# Patient Record
Sex: Female | Born: 1970 | Race: White | Hispanic: No | Marital: Married | Smoking: Never smoker
Health system: Southern US, Community
[De-identification: ages and names within clinical notes are randomized; demographics above are authoritative.]

## PROBLEM LIST (undated history)

## (undated) DIAGNOSIS — W19XXXA Unspecified fall, initial encounter: Secondary | ICD-10-CM

## (undated) DIAGNOSIS — J45909 Unspecified asthma, uncomplicated: Secondary | ICD-10-CM

## (undated) DIAGNOSIS — R112 Nausea with vomiting, unspecified: Secondary | ICD-10-CM

## (undated) DIAGNOSIS — Z9889 Other specified postprocedural states: Secondary | ICD-10-CM

---

## 2006-09-26 ENCOUNTER — Emergency Department: Payer: Self-pay | Admitting: Emergency Medicine

## 2007-02-19 ENCOUNTER — Emergency Department: Payer: Self-pay | Admitting: Emergency Medicine

## 2017-12-01 ENCOUNTER — Other Ambulatory Visit: Payer: Self-pay | Admitting: Internal Medicine

## 2017-12-01 DIAGNOSIS — Z1231 Encounter for screening mammogram for malignant neoplasm of breast: Secondary | ICD-10-CM

## 2017-12-12 ENCOUNTER — Other Ambulatory Visit: Payer: Self-pay | Admitting: Family Medicine

## 2017-12-12 DIAGNOSIS — M24812 Other specific joint derangements of left shoulder, not elsewhere classified: Secondary | ICD-10-CM

## 2017-12-28 ENCOUNTER — Ambulatory Visit: Payer: Self-pay

## 2017-12-29 ENCOUNTER — Ambulatory Visit
Admission: RE | Admit: 2017-12-29 | Discharge: 2017-12-29 | Disposition: A | Discharge status: Home / Self Care | Payer: PRIVATE HEALTH INSURANCE | Source: Ambulatory Visit | Attending: Family Medicine | Admitting: Family Medicine

## 2017-12-29 DIAGNOSIS — M24812 Other specific joint derangements of left shoulder, not elsewhere classified: Secondary | ICD-10-CM

## 2018-01-14 ENCOUNTER — Encounter: Payer: Self-pay | Admitting: Radiology

## 2018-01-14 ENCOUNTER — Ambulatory Visit
Admission: RE | Admit: 2018-01-14 | Discharge: 2018-01-14 | Disposition: A | Discharge status: Home / Self Care | Payer: PRIVATE HEALTH INSURANCE | Source: Ambulatory Visit | Attending: Internal Medicine | Admitting: Internal Medicine

## 2018-01-14 DIAGNOSIS — Z1231 Encounter for screening mammogram for malignant neoplasm of breast: Secondary | ICD-10-CM

## 2018-03-05 ENCOUNTER — Other Ambulatory Visit: Payer: Self-pay | Admitting: Orthopedic Surgery

## 2018-04-07 NOTE — Pre-Procedure Instructions (Signed)
Clarke Ricard Dillonverill  04/07/2018      [id number] - , [place] - [address] [place] [id number] Phone: [phone] Fax: [phone]    Your procedure is scheduled on June 27  [facility] at 530 [facility].M.  Call this number if you have problems the morning of surgery:  [phone]   Remember:  Do not eat or drink after midnight.     Take these medicines the morning of surgery with A SIP OF WATER    Stop taking aspirin, BC's, Goody's, Herbal medications, Fish Oil, Ibuprofen, Advil, Motrin, Vitamins     Do not wear jewelry, make-up or nail polish.  Do not wear lotions, powders, or perfumes, or deodorant.  Do not shave 48 hours prior to surgery.  Men may shave face and neck.  Do not bring valuables to the hospital.  [facility] is not responsible for any belongings or valuables.  Contacts, dentures or bridgework may not be worn into surgery.  Leave your suitcase in the car.  After surgery it may be brought to your room.  For patients admitted to the hospital, discharge time will be determined by your treatment team.  Patients discharged the day of surgery will not be allowed to drive home.   Special instructions:   [place]- Preparing For Surgery  Before surgery, you can play an important role. Because skin is not sterile, your skin needs to be as free of germs as possible. You can reduce the number of germs on your skin by washing with CHG (chlorahexidine gluconate) Soap before surgery.  CHG is an antiseptic cleaner which kills germs and bonds with the skin to continue killing germs even after washing.    Oral Hygiene is also important to reduce your risk of infection.  Remember - BRUSH YOUR TEETH THE MORNING OF SURGERY WITH YOUR REGULAR TOOTHPASTE  Please do not use if you have an allergy to CHG or antibacterial soaps. If your skin becomes reddened/irritated stop using the  CHG.  Do not shave (including legs and underarms) for at least 48 hours prior to first CHG shower. It is OK to shave your face.  Please follow these instructions carefully.   1. Shower the NIGHT BEFORE SURGERY and the MORNING OF SURGERY with CHG.   2. If you chose to wash your hair, wash your hair first as usual with your normal shampoo.  3. After you shampoo, rinse your hair and body thoroughly to remove the shampoo.  4. Use CHG as you would any other liquid soap. You can apply CHG directly to the skin and wash gently with a scrungie or a clean washcloth.   5. Apply the CHG Soap to your body ONLY FROM THE NECK DOWN.  Do not use on open wounds or open sores. Avoid contact with your eyes, ears, mouth and genitals (private parts). Wash Face and genitals (private parts)  with your normal soap.  6. Wash thoroughly, paying special attention to the area where your surgery will be performed.  7. Thoroughly rinse your body with warm water from the neck down.  8. DO NOT shower/wash with your normal soap after using and rinsing off the CHG Soap.  9. Pat yourself dry with a CLEAN TOWEL.  10. Wear CLEAN PAJAMAS to bed the night before surgery, wear comfortable clothes the morning of surgery  11. Place CLEAN SHEETS on your bed the night of your  first shower and DO NOT SLEEP WITH PETS.    Day of Surgery:  Do not apply any deodorants/lotions.  Please wear clean clothes to the hospital/surgery center.   Remember to brush your teeth WITH YOUR REGULAR TOOTHPASTE.    Please read over the following fact sheets that you were given. Pain Booklet, Coughing and Deep Breathing, MRSA Information and Surgical Site Infection Prevention

## 2018-04-08 ENCOUNTER — Encounter (HOSPITAL_COMMUNITY): Payer: Self-pay

## 2018-04-08 ENCOUNTER — Encounter (HOSPITAL_COMMUNITY)
Admission: RE | Admit: 2018-04-08 | Discharge: 2018-04-08 | Disposition: A | Discharge status: Home / Self Care | Payer: PRIVATE HEALTH INSURANCE | Source: Ambulatory Visit | Attending: Orthopedic Surgery | Admitting: Orthopedic Surgery

## 2018-04-08 DIAGNOSIS — Z01812 Encounter for preprocedural laboratory examination: Secondary | ICD-10-CM | POA: Diagnosis not present

## 2018-04-08 HISTORY — DX: Unspecified fall, initial encounter: W19.XXXA

## 2018-04-08 HISTORY — DX: Nausea with vomiting, unspecified: R11.2

## 2018-04-08 HISTORY — DX: Unspecified asthma, uncomplicated: J45.909

## 2018-04-08 HISTORY — DX: Other specified postprocedural states: Z98.890

## 2018-04-08 LAB — CBC
HCT: 44.7 % (ref 36.0–46.0)
Hemoglobin: 14.5 g/dL (ref 12.0–15.0)
MCH: 30.6 pg (ref 26.0–34.0)
MCHC: 32.4 g/dL (ref 30.0–36.0)
MCV: 94.3 fL (ref 78.0–100.0)
Platelets: 212 10*3/uL (ref 150–400)
RBC: 4.74 MIL/uL (ref 3.87–5.11)
RDW: 12.1 % (ref 11.5–15.5)
WBC: 5.8 10*3/uL (ref 4.0–10.5)

## 2018-04-14 NOTE — Anesthesia Preprocedure Evaluation (Addendum)
Anesthesia Evaluation  Patient identified by MRN, date of birth, ID band Patient awake    Reviewed: Allergy & Precautions, NPO status , Patient's Chart, lab work & pertinent test results  History of Anesthesia Complications (+) PONV  Airway Mallampati: II  TM Distance: >3 FB Neck ROM: Full    Dental  (+) Dental Advisory Given, Missing,    Pulmonary asthma ,    breath sounds clear to auscultation       Cardiovascular negative cardio ROS   Rhythm:Regular Rate:Normal     Neuro/Psych negative neurological ROS  negative psych ROS   GI/Hepatic negative GI ROS, Neg liver ROS,   Endo/Other  negative endocrine ROS  Renal/GU negative Renal ROS  negative genitourinary   Musculoskeletal negative musculoskeletal ROS (+)   Abdominal   Peds  Hematology negative hematology ROS (+)   Anesthesia Other Findings   Reproductive/Obstetrics                            Anesthesia Physical Anesthesia Plan  ASA: II  Anesthesia Plan: General   Post-op Pain Management:  Regional for Post-op pain   Induction: Intravenous  PONV Risk Score and Plan: 4 or greater and Treatment may vary due to age or medical condition, Ondansetron, Dexamethasone, Midazolam and Scopolamine patch - Pre-op  Airway Management Planned: Oral ETT  Additional Equipment: None  Intra-op Plan:   Post-operative Plan: Extubation in OR  Informed Consent: I have reviewed the patients History and Physical, chart, labs and discussed the procedure including the risks, benefits and alternatives for the proposed anesthesia with the patient or authorized representative who has indicated his/her understanding and acceptance.   Dental advisory given  Plan Discussed with: CRNA and Anesthesiologist  Anesthesia Plan Comments:         Anesthesia Quick Evaluation

## 2018-04-15 ENCOUNTER — Encounter (HOSPITAL_COMMUNITY): Payer: Self-pay | Admitting: Urology

## 2018-04-15 ENCOUNTER — Ambulatory Visit (HOSPITAL_COMMUNITY)
Admission: RE | Admit: 2018-04-15 | Discharge: 2018-04-15 | Disposition: A | Discharge status: Home / Self Care | Payer: PRIVATE HEALTH INSURANCE | Source: Ambulatory Visit | Attending: Orthopedic Surgery | Admitting: Orthopedic Surgery

## 2018-04-15 ENCOUNTER — Ambulatory Visit (HOSPITAL_COMMUNITY): Payer: PRIVATE HEALTH INSURANCE | Admitting: Anesthesiology

## 2018-04-15 ENCOUNTER — Ambulatory Visit (HOSPITAL_COMMUNITY): Payer: PRIVATE HEALTH INSURANCE

## 2018-04-15 ENCOUNTER — Encounter (HOSPITAL_COMMUNITY)
Admission: RE | Disposition: A | Discharge status: Home / Self Care | Payer: Self-pay | Source: Ambulatory Visit | Attending: Orthopedic Surgery

## 2018-04-15 DIAGNOSIS — J45909 Unspecified asthma, uncomplicated: Secondary | ICD-10-CM | POA: Diagnosis not present

## 2018-04-15 DIAGNOSIS — Z7982 Long term (current) use of aspirin: Secondary | ICD-10-CM | POA: Diagnosis not present

## 2018-04-15 DIAGNOSIS — Y939 Activity, unspecified: Secondary | ICD-10-CM | POA: Diagnosis not present

## 2018-04-15 DIAGNOSIS — Y9355 Activity, bike riding: Secondary | ICD-10-CM | POA: Diagnosis not present

## 2018-04-15 DIAGNOSIS — S43122A Dislocation of left acromioclavicular joint, 100%-200% displacement, initial encounter: Secondary | ICD-10-CM | POA: Diagnosis not present

## 2018-04-15 DIAGNOSIS — Z885 Allergy status to narcotic agent status: Secondary | ICD-10-CM | POA: Diagnosis not present

## 2018-04-15 DIAGNOSIS — X58XXXA Exposure to other specified factors, initial encounter: Secondary | ICD-10-CM | POA: Insufficient documentation

## 2018-04-15 DIAGNOSIS — S43102A Unspecified dislocation of left acromioclavicular joint, initial encounter: Secondary | ICD-10-CM | POA: Diagnosis present

## 2018-04-15 DIAGNOSIS — Z419 Encounter for procedure for purposes other than remedying health state, unspecified: Secondary | ICD-10-CM

## 2018-04-15 HISTORY — PX: ACROMIO-CLAVICULAR JOINT REPAIR: SHX5183

## 2018-04-15 LAB — POCT PREGNANCY, URINE: Preg Test, Ur: NEGATIVE

## 2018-04-15 SURGERY — REPAIR, ACROMIOCLAVICULAR JOINT
Anesthesia: General | Site: Shoulder | Laterality: Left

## 2018-04-15 MED ORDER — OXYCODONE-ACETAMINOPHEN 5-325 MG PO TABS: 1.0000 | ORAL_TABLET | ORAL | 0 refills | Status: DC | PRN

## 2018-04-15 MED ORDER — DEXAMETHASONE SODIUM PHOSPHATE 10 MG/ML IJ SOLN
INTRAMUSCULAR | Status: DC | PRN
  Administered 2018-04-15: 10 mg via INTRAVENOUS

## 2018-04-15 MED ORDER — BUPIVACAINE-EPINEPHRINE 0.25% -1:200000 IJ SOLN: INTRAMUSCULAR | Status: DC | PRN

## 2018-04-15 MED ORDER — SUGAMMADEX SODIUM 200 MG/2ML IV SOLN
INTRAVENOUS | Status: AC
  Filled 2018-04-15: qty 2

## 2018-04-15 MED ORDER — LACTATED RINGERS IV SOLN
INTRAVENOUS | Status: DC | PRN
  Administered 2018-04-15: 07:00:00 via INTRAVENOUS

## 2018-04-15 MED ORDER — OXYCODONE HCL 5 MG/5ML PO SOLN: 5.0000 mg | Freq: Once | ORAL | Status: DC | PRN

## 2018-04-15 MED ORDER — CEFAZOLIN SODIUM-DEXTROSE 2-4 GM/100ML-% IV SOLN
2.0000 g | INTRAVENOUS | Status: AC
  Administered 2018-04-15: 2 g via INTRAVENOUS

## 2018-04-15 MED ORDER — FENTANYL CITRATE (PF) 100 MCG/2ML IJ SOLN
INTRAMUSCULAR | Status: DC | PRN
  Administered 2018-04-15 (×2): 50 ug via INTRAVENOUS

## 2018-04-15 MED ORDER — 0.9 % SODIUM CHLORIDE (POUR BTL) OPTIME
TOPICAL | Status: DC | PRN
  Administered 2018-04-15 (×2): 1000 mL

## 2018-04-15 MED ORDER — ONDANSETRON HCL 4 MG PO TABS: 4.0000 mg | ORAL_TABLET | Freq: Three times a day (TID) | ORAL | 0 refills | Status: DC | PRN

## 2018-04-15 MED ORDER — FENTANYL CITRATE (PF) 250 MCG/5ML IJ SOLN
INTRAMUSCULAR | Status: AC
  Filled 2018-04-15: qty 5

## 2018-04-15 MED ORDER — CEFAZOLIN SODIUM-DEXTROSE 2-4 GM/100ML-% IV SOLN
INTRAVENOUS | Status: AC
  Filled 2018-04-15: qty 100

## 2018-04-15 MED ORDER — PHENYLEPHRINE 40 MCG/ML (10ML) SYRINGE FOR IV PUSH (FOR BLOOD PRESSURE SUPPORT)
PREFILLED_SYRINGE | INTRAVENOUS | Status: AC
  Filled 2018-04-15: qty 10

## 2018-04-15 MED ORDER — PROPOFOL 10 MG/ML IV BOLUS
INTRAVENOUS | Status: DC | PRN
  Administered 2018-04-15: 180 mg via INTRAVENOUS

## 2018-04-15 MED ORDER — ONDANSETRON HCL 4 MG/2ML IJ SOLN
INTRAMUSCULAR | Status: DC | PRN
  Administered 2018-04-15: 4 mg via INTRAVENOUS

## 2018-04-15 MED ORDER — BUPIVACAINE-EPINEPHRINE (PF) 0.5% -1:200000 IJ SOLN
INTRAMUSCULAR | Status: DC | PRN
  Administered 2018-04-15: 15 mL via PERINEURAL

## 2018-04-15 MED ORDER — BUPIVACAINE LIPOSOME 1.3 % IJ SUSP
INTRAMUSCULAR | Status: DC | PRN
  Administered 2018-04-15: 10 mL via PERINEURAL

## 2018-04-15 MED ORDER — MIDAZOLAM HCL 2 MG/2ML IJ SOLN
INTRAMUSCULAR | Status: AC
  Filled 2018-04-15: qty 2

## 2018-04-15 MED ORDER — GLYCOPYRROLATE PF 0.2 MG/ML IJ SOSY
PREFILLED_SYRINGE | INTRAMUSCULAR | Status: AC
  Filled 2018-04-15: qty 1

## 2018-04-15 MED ORDER — PROMETHAZINE HCL 25 MG/ML IJ SOLN
6.2500 mg | INTRAMUSCULAR | Status: DC | PRN
  Administered 2018-04-15: 12.5 mg via INTRAVENOUS

## 2018-04-15 MED ORDER — PROPOFOL 10 MG/ML IV BOLUS
INTRAVENOUS | Status: AC
  Filled 2018-04-15: qty 40

## 2018-04-15 MED ORDER — DOCUSATE SODIUM 100 MG PO CAPS: 100.0000 mg | ORAL_CAPSULE | Freq: Three times a day (TID) | ORAL | 0 refills | Status: DC | PRN

## 2018-04-15 MED ORDER — LIDOCAINE 2% (20 MG/ML) 5 ML SYRINGE
INTRAMUSCULAR | Status: AC
  Filled 2018-04-15: qty 5

## 2018-04-15 MED ORDER — MIDAZOLAM HCL 5 MG/5ML IJ SOLN
INTRAMUSCULAR | Status: DC | PRN
  Administered 2018-04-15 (×2): 1 mg via INTRAVENOUS

## 2018-04-15 MED ORDER — SUGAMMADEX SODIUM 200 MG/2ML IV SOLN
INTRAVENOUS | Status: DC | PRN
  Administered 2018-04-15: 200 mg via INTRAVENOUS

## 2018-04-15 MED ORDER — BUPIVACAINE-EPINEPHRINE (PF) 0.25% -1:200000 IJ SOLN
INTRAMUSCULAR | Status: AC
  Filled 2018-04-15: qty 30

## 2018-04-15 MED ORDER — POVIDONE-IODINE 7.5 % EX SOLN
Freq: Once | CUTANEOUS | Status: DC
  Filled 2018-04-15: qty 118

## 2018-04-15 MED ORDER — SCOPOLAMINE 1 MG/3DAYS TD PT72
MEDICATED_PATCH | TRANSDERMAL | Status: DC | PRN
  Administered 2018-04-15: 1 via TRANSDERMAL

## 2018-04-15 MED ORDER — FENTANYL CITRATE (PF) 100 MCG/2ML IJ SOLN: 25.0000 ug | INTRAMUSCULAR | Status: DC | PRN

## 2018-04-15 MED ORDER — PROMETHAZINE HCL 25 MG/ML IJ SOLN
INTRAMUSCULAR | Status: AC
  Filled 2018-04-15: qty 1

## 2018-04-15 MED ORDER — LIDOCAINE 2% (20 MG/ML) 5 ML SYRINGE
INTRAMUSCULAR | Status: DC | PRN
  Administered 2018-04-15: 60 mg via INTRAVENOUS

## 2018-04-15 MED ORDER — PHENYLEPHRINE HCL 10 MG/ML IJ SOLN
INTRAVENOUS | Status: DC | PRN
  Administered 2018-04-15: 25 ug/min via INTRAVENOUS

## 2018-04-15 MED ORDER — ROCURONIUM BROMIDE 10 MG/ML (PF) SYRINGE
PREFILLED_SYRINGE | INTRAVENOUS | Status: DC | PRN
  Administered 2018-04-15: 10 mg via INTRAVENOUS
  Administered 2018-04-15: 20 mg via INTRAVENOUS
  Administered 2018-04-15: 50 mg via INTRAVENOUS

## 2018-04-15 MED ORDER — PHENYLEPHRINE 40 MCG/ML (10ML) SYRINGE FOR IV PUSH (FOR BLOOD PRESSURE SUPPORT)
PREFILLED_SYRINGE | INTRAVENOUS | Status: DC | PRN
  Administered 2018-04-15 (×3): 80 ug via INTRAVENOUS

## 2018-04-15 MED ORDER — ROCURONIUM BROMIDE 50 MG/5ML IV SOLN
INTRAVENOUS | Status: AC
  Filled 2018-04-15: qty 1

## 2018-04-15 MED ORDER — OXYCODONE HCL 5 MG PO TABS: 5.0000 mg | ORAL_TABLET | Freq: Once | ORAL | Status: DC | PRN

## 2018-04-15 MED ORDER — GLYCOPYRROLATE PF 0.2 MG/ML IJ SOSY
PREFILLED_SYRINGE | INTRAMUSCULAR | Status: DC | PRN
  Administered 2018-04-15: .2 mg via INTRAVENOUS

## 2018-04-15 SURGICAL SUPPLY — 63 items
ANCHOR SUT 1.45 SZ 1 SHORT (Anchor) ×3 IMPLANT
BIT DRILL 3.5X5.5 QC CALB (BIT) ×3 IMPLANT
BLADE LONG MED 31MMX9MM (MISCELLANEOUS) ×1
BLADE LONG MED 31X9 (MISCELLANEOUS) ×2 IMPLANT
CHLORAPREP W/TINT 26ML (MISCELLANEOUS) ×3 IMPLANT
CLOSURE WOUND 1/2 X4 (GAUZE/BANDAGES/DRESSINGS) ×1
COVER SURGICAL LIGHT HANDLE (MISCELLANEOUS) ×3 IMPLANT
DECANTER SPIKE VIAL GLASS SM (MISCELLANEOUS) ×3 IMPLANT
DRAPE C-ARM 42X72 X-RAY (DRAPES) IMPLANT
DRAPE IMP U-DRAPE 54X76 (DRAPES) ×3 IMPLANT
DRAPE INCISE IOBAN 66X45 STRL (DRAPES) ×3 IMPLANT
DRAPE SURG 17X23 STRL (DRAPES) ×6 IMPLANT
DRAPE U-SHAPE 47X51 STRL (DRAPES) ×3 IMPLANT
DRSG MEPILEX BORDER 4X8 (GAUZE/BANDAGES/DRESSINGS) ×3 IMPLANT
ELECT REM PT RETURN 9FT ADLT (ELECTROSURGICAL) ×3
ELECTRODE REM PT RTRN 9FT ADLT (ELECTROSURGICAL) ×1 IMPLANT
GLOVE BIO SURGEON STRL SZ7 (GLOVE) ×3 IMPLANT
GLOVE BIO SURGEON STRL SZ7.5 (GLOVE) ×6 IMPLANT
GLOVE BIOGEL PI IND STRL 7.0 (GLOVE) ×1 IMPLANT
GLOVE BIOGEL PI IND STRL 8 (GLOVE) ×1 IMPLANT
GLOVE BIOGEL PI INDICATOR 7.0 (GLOVE) ×2
GLOVE BIOGEL PI INDICATOR 8 (GLOVE) ×2
GLOVE SURG SS PI 7.0 STRL IVOR (GLOVE) ×3 IMPLANT
GOWN STRL REUS W/ TWL LRG LVL3 (GOWN DISPOSABLE) ×2 IMPLANT
GOWN STRL REUS W/ TWL XL LVL3 (GOWN DISPOSABLE) ×1 IMPLANT
GOWN STRL REUS W/TWL LRG LVL3 (GOWN DISPOSABLE) ×4
GOWN STRL REUS W/TWL XL LVL3 (GOWN DISPOSABLE) ×2
KIT AC JOINT DISP (KITS) ×3 IMPLANT
KIT BASIN OR (CUSTOM PROCEDURE TRAY) ×3 IMPLANT
KIT BIO-TENODESIS 3X8 DISP (MISCELLANEOUS)
KIT INSRT BABSR STRL DISP BTN (MISCELLANEOUS) IMPLANT
KIT TURNOVER KIT B (KITS) ×3 IMPLANT
MANIFOLD NEPTUNE II (INSTRUMENTS) ×3 IMPLANT
NEEDLE HYPO 25GX1X1/2 BEV (NEEDLE) ×3 IMPLANT
NS IRRIG 1000ML POUR BTL (IV SOLUTION) ×6 IMPLANT
PACK SHOULDER (CUSTOM PROCEDURE TRAY) ×3 IMPLANT
PACK UNIVERSAL I (CUSTOM PROCEDURE TRAY) ×3 IMPLANT
PAD ARMBOARD 7.5X6 YLW CONV (MISCELLANEOUS) ×3 IMPLANT
RETRIEVER SUT HEWSON (MISCELLANEOUS) ×3 IMPLANT
SLING ARM FOAM STRAP LRG (SOFTGOODS) ×3 IMPLANT
SLING ARM IMMOBILIZER MED (SOFTGOODS) ×3 IMPLANT
STRIP CLOSURE SKIN 1/2X4 (GAUZE/BANDAGES/DRESSINGS) ×2 IMPLANT
SUCTION FRAZIER HANDLE 10FR (MISCELLANEOUS) ×2
SUCTION TUBE FRAZIER 10FR DISP (MISCELLANEOUS) ×1 IMPLANT
SUPPORT WRAP ARM LG (MISCELLANEOUS) IMPLANT
SUT FIBERWIRE #2 38 T-5 BLUE (SUTURE) ×3
SUT MAXBRAID (SUTURE) ×3 IMPLANT
SUT MON AB 4-0 PC3 18 (SUTURE) ×3 IMPLANT
SUT PDS AB 1 CT  36 (SUTURE) ×2
SUT PDS AB 1 CT 36 (SUTURE) ×1 IMPLANT
SUT VIC AB 0 CT1 27 (SUTURE) ×4
SUT VIC AB 0 CT1 27XBRD ANBCTR (SUTURE) ×2 IMPLANT
SUT VIC AB 2-0 CT1 27 (SUTURE) ×2
SUT VIC AB 2-0 CT1 TAPERPNT 27 (SUTURE) ×1 IMPLANT
SUTURE FIBERWR #2 38 T-5 BLUE (SUTURE) ×1 IMPLANT
SYR CONTROL 10ML LL (SYRINGE) ×3 IMPLANT
TAPE FIBER 2MM 7IN #2 BLUE (SUTURE) IMPLANT
TENDON GRACILIS FROZEN (Bone Implant) ×3 IMPLANT
TENDON GRACILIS FROZEN 230-320 (Bone Implant) ×1 IMPLANT
TOWEL OR 17X26 10 PK STRL BLUE (TOWEL DISPOSABLE) ×3 IMPLANT
WATER STERILE IRR 1000ML POUR (IV SOLUTION) IMPLANT
ZIPLOOP AC JOINT REPAIR (Orthopedic Implant) IMPLANT
ZIPLOOP FIXATION AC JOINT DBL (Orthopedic Implant) ×3 IMPLANT

## 2018-04-15 NOTE — Anesthesia Postprocedure Evaluation (Signed)
Anesthesia Post Note  Patient: Penny Robles  Procedure(s) Performed: LEFT SHOULDER ACROMIO-CLAVICULAR JOINT RECONSTRUCTION WITH DISTAL CLAVICLE EXCISION (Left Shoulder)     Patient location during evaluation: PACU Anesthesia Type: General Level of consciousness: awake and alert Pain management: pain level controlled Vital Signs Assessment: post-procedure vital signs reviewed and stable Respiratory status: spontaneous breathing, nonlabored ventilation and respiratory function stable Cardiovascular status: blood pressure returned to baseline and stable Postop Assessment: no apparent nausea or vomiting Anesthetic complications: no    Last Vitals:  Vitals:   04/15/18 1000 04/15/18 1020  BP: 98/61 97/64  Pulse: 67 65  Resp: 16 14  Temp:  (!) 36.3 C  SpO2: 97% 97%    Last Pain:  Vitals:   04/15/18 1020  TempSrc:   PainSc: 0-No pain                 Beryle Lathehomas E Oluwatamilore Starnes
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
## 2018-04-15 NOTE — Anesthesia Procedure Notes (Signed)
Procedure Name: Intubation Date/Time: 04/15/2018 7:33 AM Performed by: Waynard EdwardsSmith, Philippe Gang A, CRNA Pre-anesthesia Checklist: Patient identified, Emergency Drugs available, Suction available and Patient being monitored Patient Re-evaluated:Patient Re-evaluated prior to induction Oxygen Delivery Method: Circle system utilized Preoxygenation: Pre-oxygenation with 100% oxygen Induction Type: IV induction Ventilation: Mask ventilation without difficulty Laryngoscope Size: Miller and 2 Grade View: Grade I Tube type: Oral Tube size: 7.0 mm Number of attempts: 1 Airway Equipment and Method: Stylet Placement Confirmation: ETT inserted through vocal cords under direct vision,  positive ETCO2 and breath sounds checked- equal and bilateral Secured at: 22 cm Tube secured with: Tape Dental Injury: Teeth and Oropharynx as per pre-operative assessment

## 2018-04-15 NOTE — Anesthesia Procedure Notes (Signed)
Anesthesia Regional Block: Interscalene brachial plexus block   Pre-Anesthetic Checklist: ,, timeout performed, Correct Patient, Correct Site, Correct Laterality, Correct Procedure, Correct Position, site marked, Risks and benefits discussed,  Surgical consent,  Pre-op evaluation,  At surgeon's request and post-op pain management  Laterality: Left  Prep: chloraprep       Needles:  Injection technique: Single-shot  Needle Type: Echogenic Needle     Needle Length: 5cm  Needle Gauge: 21     Additional Needles:   Narrative:  Start time: 04/15/2018 7:08 AM End time: 04/15/2018 7:12 AM Injection made incrementally with aspirations every 5 mL.  Performed by: Personally  Anesthesiologist: Beryle LatheBrock, Marquavis Hannen E, MD  Additional Notes: No pain on injection. No increased resistance to injection. Injection made in 5cc increments. Good needle visualization. Patient tolerated the procedure well.
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
## 2018-04-15 NOTE — Progress Notes (Signed)
Report given to lisa rn as caregiver 

## 2018-04-15 NOTE — Discharge Instructions (Signed)

## 2018-04-15 NOTE — Transfer of Care (Signed)
Immediate Anesthesia Transfer of Care Note  Patient: Pranathi Anastasia  Procedure(s) Performed: LEFT SHOULDER ACROMIO-CLAVICULAR JOINT RECONSTRUCTION WITH DISTAL CLAVICLE EXCISION (Left Shoulder)  Patient Location: PACU  Anesthesia Type:GA combined with regional for post-op pain  Level of Consciousness: drowsy and patient cooperative  Airway & Oxygen Therapy: Patient Spontanous Breathing and Patient connected to nasal cannula oxygen  Post-op Assessment: Report given to RN and Post -op Vital signs reviewed and stable  Post vital signs: Reviewed and stable  Last Vitals:  Vitals Value Taken Time  BP 116/79 04/15/2018  9:35 AM  Temp    Pulse 63 04/15/2018  9:37 AM  Resp 14 04/15/2018  9:37 AM  SpO2 98 % 04/15/2018  9:37 AM  Vitals shown include unvalidated device data.  Last Pain:  Vitals:   04/15/18 0546  TempSrc:   PainSc: 0-No pain         Complications: No apparent anesthesia complications

## 2018-04-16 ENCOUNTER — Encounter (HOSPITAL_COMMUNITY): Payer: Self-pay | Admitting: Orthopedic Surgery

## 2018-05-07 ENCOUNTER — Other Ambulatory Visit: Payer: Self-pay | Admitting: Orthopedic Surgery

## 2018-05-10 ENCOUNTER — Encounter (HOSPITAL_COMMUNITY): Payer: Self-pay | Admitting: *Deleted

## 2018-05-10 ENCOUNTER — Other Ambulatory Visit: Payer: Self-pay

## 2018-05-10 NOTE — Progress Notes (Signed)
Spoke with pt for pre-op call. Pt denies cardiac history, chest pain or sob. Pt is not diabetic. 

## 2018-05-11 ENCOUNTER — Ambulatory Visit (HOSPITAL_COMMUNITY)
Admission: RE | Admit: 2018-05-11 | Discharge: 2018-05-11 | Disposition: A | Discharge status: Home / Self Care | Payer: PRIVATE HEALTH INSURANCE | Source: Ambulatory Visit | Attending: Orthopedic Surgery | Admitting: Orthopedic Surgery

## 2018-05-11 ENCOUNTER — Encounter (HOSPITAL_COMMUNITY): Payer: Self-pay | Admitting: *Deleted

## 2018-05-11 ENCOUNTER — Ambulatory Visit (HOSPITAL_COMMUNITY): Payer: PRIVATE HEALTH INSURANCE | Admitting: Certified Registered"

## 2018-05-11 ENCOUNTER — Encounter (HOSPITAL_COMMUNITY)
Admission: RE | Disposition: A | Discharge status: Home / Self Care | Payer: Self-pay | Source: Ambulatory Visit | Attending: Orthopedic Surgery

## 2018-05-11 DIAGNOSIS — J45909 Unspecified asthma, uncomplicated: Secondary | ICD-10-CM | POA: Insufficient documentation

## 2018-05-11 DIAGNOSIS — X58XXXA Exposure to other specified factors, initial encounter: Secondary | ICD-10-CM | POA: Diagnosis not present

## 2018-05-11 DIAGNOSIS — S43112A Subluxation of left acromioclavicular joint, initial encounter: Secondary | ICD-10-CM | POA: Diagnosis present

## 2018-05-11 DIAGNOSIS — Z79899 Other long term (current) drug therapy: Secondary | ICD-10-CM | POA: Diagnosis not present

## 2018-05-11 DIAGNOSIS — Y929 Unspecified place or not applicable: Secondary | ICD-10-CM | POA: Diagnosis not present

## 2018-05-11 HISTORY — PX: ACROMIO-CLAVICULAR JOINT REPAIR: SHX5183

## 2018-05-11 LAB — CBC
HEMATOCRIT: 42.7 % (ref 36.0–46.0)
HEMOGLOBIN: 13.8 g/dL (ref 12.0–15.0)
MCH: 30.9 pg (ref 26.0–34.0)
MCHC: 32.3 g/dL (ref 30.0–36.0)
MCV: 95.5 fL (ref 78.0–100.0)
Platelets: 203 10*3/uL (ref 150–400)
RBC: 4.47 MIL/uL (ref 3.87–5.11)
RDW: 12.1 % (ref 11.5–15.5)
WBC: 4.3 10*3/uL (ref 4.0–10.5)

## 2018-05-11 LAB — POCT PREGNANCY, URINE: Preg Test, Ur: NEGATIVE

## 2018-05-11 SURGERY — REPAIR, ACROMIOCLAVICULAR JOINT
Anesthesia: General | Site: Shoulder | Laterality: Left

## 2018-05-11 MED ORDER — FENTANYL CITRATE (PF) 100 MCG/2ML IJ SOLN
INTRAMUSCULAR | Status: DC | PRN
  Administered 2018-05-11 (×2): 50 ug via INTRAVENOUS

## 2018-05-11 MED ORDER — SODIUM CHLORIDE 0.9 % IV SOLN
INTRAVENOUS | Status: DC | PRN
  Administered 2018-05-11: 25 ug/min via INTRAVENOUS

## 2018-05-11 MED ORDER — MIDAZOLAM HCL 2 MG/2ML IJ SOLN
INTRAMUSCULAR | Status: AC
  Filled 2018-05-11: qty 2

## 2018-05-11 MED ORDER — SUGAMMADEX SODIUM 200 MG/2ML IV SOLN
INTRAVENOUS | Status: DC | PRN
  Administered 2018-05-11: 129 mg via INTRAVENOUS

## 2018-05-11 MED ORDER — SCOPOLAMINE 1 MG/3DAYS TD PT72
1.0000 | MEDICATED_PATCH | TRANSDERMAL | Status: DC
  Administered 2018-05-11: 1 via TRANSDERMAL

## 2018-05-11 MED ORDER — LIDOCAINE 2% (20 MG/ML) 5 ML SYRINGE
INTRAMUSCULAR | Status: AC
  Filled 2018-05-11: qty 5

## 2018-05-11 MED ORDER — ONDANSETRON HCL 4 MG/2ML IJ SOLN
INTRAMUSCULAR | Status: AC
  Filled 2018-05-11: qty 2

## 2018-05-11 MED ORDER — LIDOCAINE HCL (CARDIAC) PF 100 MG/5ML IV SOSY
PREFILLED_SYRINGE | INTRAVENOUS | Status: DC | PRN
  Administered 2018-05-11: 100 mg via INTRAVENOUS

## 2018-05-11 MED ORDER — FENTANYL CITRATE (PF) 100 MCG/2ML IJ SOLN
50.0000 ug | Freq: Once | INTRAMUSCULAR | Status: AC
  Administered 2018-05-11: 50 ug via INTRAVENOUS

## 2018-05-11 MED ORDER — SODIUM CHLORIDE 0.9 % IR SOLN
Status: DC | PRN
  Administered 2018-05-11: 1000 mL

## 2018-05-11 MED ORDER — PROPOFOL 10 MG/ML IV BOLUS
INTRAVENOUS | Status: DC | PRN
  Administered 2018-05-11: 110 mg via INTRAVENOUS

## 2018-05-11 MED ORDER — EPHEDRINE SULFATE 50 MG/ML IJ SOLN
INTRAMUSCULAR | Status: DC | PRN
  Administered 2018-05-11 (×4): 5 mg via INTRAVENOUS

## 2018-05-11 MED ORDER — PROPOFOL 10 MG/ML IV BOLUS
INTRAVENOUS | Status: AC
  Filled 2018-05-11: qty 40

## 2018-05-11 MED ORDER — PHENYLEPHRINE 40 MCG/ML (10ML) SYRINGE FOR IV PUSH (FOR BLOOD PRESSURE SUPPORT)
PREFILLED_SYRINGE | INTRAVENOUS | Status: DC | PRN
  Administered 2018-05-11 (×2): 40 ug via INTRAVENOUS

## 2018-05-11 MED ORDER — FENTANYL CITRATE (PF) 100 MCG/2ML IJ SOLN
INTRAMUSCULAR | Status: AC
  Administered 2018-05-11: 50 ug via INTRAVENOUS
  Filled 2018-05-11: qty 2

## 2018-05-11 MED ORDER — LACTATED RINGERS IV SOLN
INTRAVENOUS | Status: DC
  Administered 2018-05-11 (×2): via INTRAVENOUS

## 2018-05-11 MED ORDER — MEPERIDINE HCL 50 MG/ML IJ SOLN: 6.2500 mg | INTRAMUSCULAR | Status: DC | PRN

## 2018-05-11 MED ORDER — DEXAMETHASONE SODIUM PHOSPHATE 10 MG/ML IJ SOLN
INTRAMUSCULAR | Status: DC | PRN
  Administered 2018-05-11: 10 mg via INTRAVENOUS

## 2018-05-11 MED ORDER — BUPIVACAINE-EPINEPHRINE (PF) 0.5% -1:200000 IJ SOLN
INTRAMUSCULAR | Status: DC | PRN
  Administered 2018-05-11: 30 mL via PERINEURAL

## 2018-05-11 MED ORDER — PROMETHAZINE HCL 25 MG/ML IJ SOLN
6.2500 mg | Freq: Once | INTRAMUSCULAR | Status: AC
  Administered 2018-05-11: 6.25 mg via INTRAVENOUS

## 2018-05-11 MED ORDER — HYDROMORPHONE HCL 1 MG/ML IJ SOLN: 0.2500 mg | INTRAMUSCULAR | Status: DC | PRN

## 2018-05-11 MED ORDER — CHLORHEXIDINE GLUCONATE 4 % EX LIQD: 60.0000 mL | Freq: Once | CUTANEOUS | Status: DC

## 2018-05-11 MED ORDER — CEFAZOLIN SODIUM-DEXTROSE 2-4 GM/100ML-% IV SOLN
2.0000 g | INTRAVENOUS | Status: AC
  Administered 2018-05-11: 2 g via INTRAVENOUS

## 2018-05-11 MED ORDER — ONDANSETRON HCL 4 MG/2ML IJ SOLN
4.0000 mg | Freq: Once | INTRAMUSCULAR | Status: AC | PRN
  Administered 2018-05-11: 4 mg via INTRAVENOUS

## 2018-05-11 MED ORDER — MIDAZOLAM HCL 2 MG/2ML IJ SOLN
INTRAMUSCULAR | Status: AC
  Administered 2018-05-11: 2 mg via INTRAVENOUS
  Filled 2018-05-11: qty 2

## 2018-05-11 MED ORDER — DOCUSATE SODIUM 100 MG PO CAPS: 100.0000 mg | ORAL_CAPSULE | Freq: Three times a day (TID) | ORAL | 0 refills | Status: AC | PRN

## 2018-05-11 MED ORDER — SUCCINYLCHOLINE CHLORIDE 200 MG/10ML IV SOSY
PREFILLED_SYRINGE | INTRAVENOUS | Status: AC
  Filled 2018-05-11: qty 10

## 2018-05-11 MED ORDER — FENTANYL CITRATE (PF) 250 MCG/5ML IJ SOLN
INTRAMUSCULAR | Status: AC
  Filled 2018-05-11: qty 5

## 2018-05-11 MED ORDER — PROMETHAZINE HCL 25 MG/ML IJ SOLN
INTRAMUSCULAR | Status: AC
  Filled 2018-05-11: qty 1

## 2018-05-11 MED ORDER — CEFAZOLIN SODIUM-DEXTROSE 2-4 GM/100ML-% IV SOLN
INTRAVENOUS | Status: AC
  Filled 2018-05-11: qty 100

## 2018-05-11 MED ORDER — EPHEDRINE SULFATE 50 MG/ML IJ SOLN
INTRAMUSCULAR | Status: AC
  Filled 2018-05-11: qty 1

## 2018-05-11 MED ORDER — DEXAMETHASONE SODIUM PHOSPHATE 10 MG/ML IJ SOLN
INTRAMUSCULAR | Status: AC
  Filled 2018-05-11: qty 1

## 2018-05-11 MED ORDER — MIDAZOLAM HCL 2 MG/2ML IJ SOLN
2.0000 mg | Freq: Once | INTRAMUSCULAR | Status: AC
  Administered 2018-05-11: 2 mg via INTRAVENOUS

## 2018-05-11 MED ORDER — ROCURONIUM BROMIDE 100 MG/10ML IV SOLN
INTRAVENOUS | Status: DC | PRN
  Administered 2018-05-11: 50 mg via INTRAVENOUS

## 2018-05-11 MED ORDER — ONDANSETRON HCL 4 MG/2ML IJ SOLN
INTRAMUSCULAR | Status: DC | PRN
  Administered 2018-05-11 (×2): 4 mg via INTRAVENOUS

## 2018-05-11 MED ORDER — ROCURONIUM BROMIDE 10 MG/ML (PF) SYRINGE
PREFILLED_SYRINGE | INTRAVENOUS | Status: AC
  Filled 2018-05-11: qty 10

## 2018-05-11 MED ORDER — SCOPOLAMINE 1 MG/3DAYS TD PT72
MEDICATED_PATCH | TRANSDERMAL | Status: AC
  Administered 2018-05-11: 1 via TRANSDERMAL
  Filled 2018-05-11: qty 1

## 2018-05-11 MED ORDER — ONDANSETRON HCL 4 MG PO TABS: 4.0000 mg | ORAL_TABLET | Freq: Three times a day (TID) | ORAL | 0 refills | Status: AC | PRN

## 2018-05-11 MED ORDER — OXYCODONE-ACETAMINOPHEN 5-325 MG PO TABS: 1.0000 | ORAL_TABLET | ORAL | 0 refills | Status: AC | PRN

## 2018-05-11 SURGICAL SUPPLY — 61 items
BLADE AVERAGE 25MMX9MM (BLADE)
BLADE AVERAGE 25X9 (BLADE) IMPLANT
CANISTER SUCTION WELLS/JOHNSON (MISCELLANEOUS) ×3 IMPLANT
CHLORAPREP W/TINT 26ML (MISCELLANEOUS) ×3 IMPLANT
CLOSURE WOUND 1/2 X4 (GAUZE/BANDAGES/DRESSINGS) ×1
COVER SURGICAL LIGHT HANDLE (MISCELLANEOUS) ×3 IMPLANT
DRAPE C-ARM 42X72 X-RAY (DRAPES) IMPLANT
DRAPE IMP U-DRAPE 54X76 (DRAPES) ×3 IMPLANT
DRAPE INCISE IOBAN 66X45 STRL (DRAPES) IMPLANT
DRAPE OEC MINIVIEW 54X84 (DRAPES) ×3 IMPLANT
DRAPE SURG 17X23 STRL (DRAPES) ×6 IMPLANT
DRAPE U-SHAPE 47X51 STRL (DRAPES) ×3 IMPLANT
DRSG MEPILEX BORDER 4X8 (GAUZE/BANDAGES/DRESSINGS) ×3 IMPLANT
ELECT BLADE 4.0 EZ CLEAN MEGAD (MISCELLANEOUS) ×3
ELECT REM PT RETURN 9FT ADLT (ELECTROSURGICAL) ×3
ELECTRODE BLDE 4.0 EZ CLN MEGD (MISCELLANEOUS) ×1 IMPLANT
ELECTRODE REM PT RTRN 9FT ADLT (ELECTROSURGICAL) ×1 IMPLANT
GLOVE BIO SURGEON STRL SZ7 (GLOVE) ×3 IMPLANT
GLOVE BIO SURGEON STRL SZ7.5 (GLOVE) ×3 IMPLANT
GLOVE BIOGEL PI IND STRL 7.0 (GLOVE) ×1 IMPLANT
GLOVE BIOGEL PI IND STRL 8 (GLOVE) ×1 IMPLANT
GLOVE BIOGEL PI INDICATOR 7.0 (GLOVE) ×2
GLOVE BIOGEL PI INDICATOR 8 (GLOVE) ×2
GOWN STRL REUS W/ TWL LRG LVL3 (GOWN DISPOSABLE) ×2 IMPLANT
GOWN STRL REUS W/ TWL XL LVL3 (GOWN DISPOSABLE) ×1 IMPLANT
GOWN STRL REUS W/TWL LRG LVL3 (GOWN DISPOSABLE) ×4
GOWN STRL REUS W/TWL XL LVL3 (GOWN DISPOSABLE) ×2
KIT BASIN OR (CUSTOM PROCEDURE TRAY) ×3 IMPLANT
KIT BIO-TENODESIS 3X8 DISP (MISCELLANEOUS)
KIT INSRT BABSR STRL DISP BTN (MISCELLANEOUS) IMPLANT
KIT TURNOVER KIT B (KITS) ×3 IMPLANT
MANIFOLD NEPTUNE II (INSTRUMENTS) IMPLANT
NEEDLE HYPO 25GX1X1/2 BEV (NEEDLE) IMPLANT
NS IRRIG 1000ML POUR BTL (IV SOLUTION) ×3 IMPLANT
PACK SHOULDER (CUSTOM PROCEDURE TRAY) ×3 IMPLANT
PACK UNIVERSAL I (CUSTOM PROCEDURE TRAY) IMPLANT
PAD ARMBOARD 7.5X6 YLW CONV (MISCELLANEOUS) ×3 IMPLANT
RESTRAINT HEAD UNIVERSAL NS (MISCELLANEOUS) ×3 IMPLANT
RETRIEVER SUT HEWSON (MISCELLANEOUS) ×3 IMPLANT
SLING ARM FOAM STRAP LRG (SOFTGOODS) ×3 IMPLANT
SLING ARM FOAM STRAP MED (SOFTGOODS) IMPLANT
STRIP CLOSURE SKIN 1/2X4 (GAUZE/BANDAGES/DRESSINGS) ×2 IMPLANT
SUCTION FRAZIER HANDLE 10FR (MISCELLANEOUS) ×2
SUCTION TUBE FRAZIER 10FR DISP (MISCELLANEOUS) ×1 IMPLANT
SUPPORT WRAP ARM LG (MISCELLANEOUS) IMPLANT
SUT FIBERWIRE #2 38 T-5 BLUE (SUTURE) ×9
SUT MON AB 4-0 PC3 18 (SUTURE) ×3 IMPLANT
SUT PDS AB 1 CT  36 (SUTURE)
SUT PDS AB 1 CT 36 (SUTURE) IMPLANT
SUT VIC AB 0 CT1 27 (SUTURE) ×4
SUT VIC AB 0 CT1 27XBRD ANBCTR (SUTURE) ×2 IMPLANT
SUT VIC AB 2-0 CT1 27 (SUTURE) ×4
SUT VIC AB 2-0 CT1 TAPERPNT 27 (SUTURE) ×2 IMPLANT
SUTURE FIBERWR #2 38 T-5 BLUE (SUTURE) ×3 IMPLANT
SYR CONTROL 10ML LL (SYRINGE) IMPLANT
TAPE FIBER 2MM 7IN #2 BLUE (SUTURE) ×6 IMPLANT
TENDON GRACILIS FROZEN (Bone Implant) ×3 IMPLANT
TENDON GRACILIS FROZEN 230-320 (Bone Implant) ×1 IMPLANT
TOWEL OR 17X24 6PK STRL BLUE (TOWEL DISPOSABLE) ×3 IMPLANT
TOWEL OR 17X26 10 PK STRL BLUE (TOWEL DISPOSABLE) ×3 IMPLANT
WATER STERILE IRR 1000ML POUR (IV SOLUTION) IMPLANT

## 2018-05-11 NOTE — Anesthesia Postprocedure Evaluation (Signed)
Anesthesia Post Note  Patient: Penny Robles  Procedure(s) Performed: REVISION LEFT AC RECONSTRUCTION (Left Shoulder)     Patient location during evaluation: PACU Anesthesia Type: General Level of consciousness: awake and alert Pain management: pain level controlled Vital Signs Assessment: post-procedure vital signs reviewed and stable Respiratory status: spontaneous breathing, nonlabored ventilation, respiratory function stable and patient connected to nasal cannula oxygen Cardiovascular status: blood pressure returned to baseline and stable Postop Assessment: no apparent nausea or vomiting Anesthetic complications: no    Last Vitals:  Vitals:   05/11/18 1404 05/11/18 1420  BP: (!) 112/58 122/74  Pulse: 94 84  Resp: 13 16  Temp: 36.5 C   SpO2: 100% 98%    Last Pain:  Vitals:   05/11/18 1404  TempSrc:   PainSc: Asleep                 Lachandra Dettmann DAVID

## 2018-05-11 NOTE — Anesthesia Preprocedure Evaluation (Addendum)
Anesthesia Evaluation  Patient identified by MRN, date of birth, ID band Patient awake    Reviewed: Allergy & Precautions, NPO status , Patient's Chart, lab work & pertinent test results  History of Anesthesia Complications (+) PONV  Airway Mallampati: I  TM Distance: >3 FB Neck ROM: Full    Dental  (+) Teeth Intact, Dental Advisory Given,    Pulmonary    Pulmonary exam normal        Cardiovascular Normal cardiovascular exam     Neuro/Psych    GI/Hepatic   Endo/Other    Renal/GU      Musculoskeletal   Abdominal   Peds  Hematology   Anesthesia Other Findings   Reproductive/Obstetrics                           Anesthesia Physical Anesthesia Plan  ASA: II  Anesthesia Plan: General   Post-op Pain Management:  Regional for Post-op pain   Induction:   PONV Risk Score and Plan: 3 and Ondansetron, Scopolamine patch - Pre-op and Midazolam  Airway Management Planned: Oral ETT  Additional Equipment:   Intra-op Plan:   Post-operative Plan: Extubation in OR  Informed Consent: I have reviewed the patients History and Physical, chart, labs and discussed the procedure including the risks, benefits and alternatives for the proposed anesthesia with the patient or authorized representative who has indicated his/her understanding and acceptance.     Plan Discussed with: CRNA and Surgeon  Anesthesia Plan Comments:         Anesthesia Quick Evaluation

## 2018-05-11 NOTE — Anesthesia Procedure Notes (Addendum)
Procedure Name: Intubation Date/Time: 05/11/2018 12:15 PM Performed by: Arta Brucessey, Kevin, MD Pre-anesthesia Checklist: Patient identified, Emergency Drugs available, Suction available and Patient being monitored Patient Re-evaluated:Patient Re-evaluated prior to induction Oxygen Delivery Method: Circle System Utilized Preoxygenation: Pre-oxygenation with 100% oxygen Induction Type: IV induction Ventilation: Mask ventilation without difficulty Laryngoscope Size: Miller and 2 Grade View: Grade I Tube type: Oral Number of attempts: 1 Airway Equipment and Method: Stylet and Oral airway Placement Confirmation: ETT inserted through vocal cords under direct vision,  positive ETCO2 and breath sounds checked- equal and bilateral Secured at: 23 cm Tube secured with: Tape Dental Injury: Teeth and Oropharynx as per pre-operative assessment  Comments: Penny ApleyBrooke Robles preformed intubation, SRNA

## 2018-05-11 NOTE — Transfer of Care (Signed)
Immediate Anesthesia Transfer of Care Note  Patient: Penny Robles  Procedure(s) Performed: REVISION LEFT AC RECONSTRUCTION (Left Shoulder)  Patient Location: PACU  Anesthesia Type:General  Level of Consciousness: drowsy  Airway & Oxygen Therapy: Patient Spontanous Breathing  Post-op Assessment: Report given to RN  Post vital signs: Reviewed and stable  Last Vitals:  Vitals Value Taken Time  BP    Temp    Pulse 94 05/11/2018  2:04 PM  Resp 13 05/11/2018  2:04 PM  SpO2 100 % 05/11/2018  2:04 PM  Vitals shown include unvalidated device data.  Last Pain:  Vitals:   05/11/18 1109  TempSrc:   PainSc: 0-No pain      Patients Stated Pain Goal: 5 (05/11/18 1109)  Complications: No apparent anesthesia complications

## 2018-05-11 NOTE — Op Note (Signed)
Procedure(s): REVISION LEFT AC RECONSTRUCTION Procedure Note  Penny Robles female 47 y.o. 05/11/2018  Procedure(s) and Anesthesia Type:    * REVISION LEFT AC RECONSTRUCTION - Choice      #2 deep hardware removal left shoulder  Surgeon(s) and Role:    Penny Robles* Penny Mihalko, MD - Primary   Indications:  47 y.o. female s/p [facility]C joint reconstruction with allograft.  The patient was out of her sling and doing activities beyond the recommended restrictions when she had a recurrence of deformity and pain.  X-rays revealed complete disruption of the reconstruction.  Indicated for revision reconstruction.     Surgeon: Penny Robles   Assistants: Penny Lackanielle Lalibert PA-C [facility](Penny Robles was present and scrubbed throughout the procedure and was essential in positioning, retraction, exposure, and closure)  Anesthesia: General endotracheal anesthesia with preoperative interscalene block given by the attending anesthesiologist   Procedure Detail    Findings: Hardware was removed including the button on the dorsal aspect of the clavicle as well as the metallic button beneath the coracoid.  The coracoclavicular construct was reconstructed using 2 fiber tapes and then additional gracilis allograft around the underside of the coracoid and up through the previously drilled holes in the clavicle.  Estimated Blood Loss:  less than 50 mL         Drains: none  Blood Given: none         Specimens: none        Complications:  * No complications entered in OR log *         Disposition: PACU - hemodynamically stable.         Condition: stable    Procedure:  DESCRIPTION OF PROCEDURE: The patient was identified in preoperative  holding area where I personally marked the operative site after  verifying site, side, and procedure with the patient. The patient was taken back  to the operating room where general anesthesia was induced without  complication and was placed in the beach-chair position with the  back  elevated about 40 degrees and all extremities carefully padded and  positioned. The neck was turned very slightly away from the operative field  to assist in exposure. The left upper extremity was then prepped and  draped in a standard sterile fashion. The appropriate time-out  procedure was carried out. The patient did receive IV antibiotics  within 30 minutes of incision.  An incision was made in [employer] lines over the previous incision site and extended anteriorly about 1.5 cm.  Dissection was carried down to the deltotrapezial fascia which was split over the clavicle.  All of this appeared well-healed with no signs of infection or dehiscence.  The dorsal surface of the clavicle was identified in the sutures and allograft were all noted to be disrupted.  This was all removed.  The dorsal button was also removed.  Attention was then turned down to the superior aspect of the coracoid which was carefully exposed.  Care was taken to expose the lateral and medial aspect of the coracoid.  Using fluoroscopic imaging a small needle driver was used to carefully remove the subcoracoid button. A 0 Vicryl suture was then passed beneath the coracoid with a Cooley clamp.  This was used as a shuttle suture the past 2 fiber tapes and the prepared gracillus allograft.  All of the sutures and allograft were then passed up through medial and lateral drill tunnels in the clavicle which were still intact from the previous surgery.  The joint was then  held reduced with downward pressure on the clavicle and upward pressure on the humerus while a fiber tape was tied over top of the clavicle.  Fluoroscopic imaging demonstrated anatomic reduction of the [facility] joint.  At this point the second fiber tape was tied and then the allograft was tied to itself and secured with 3 #2 fiber wires in figure-of-eight fashion.  The construct was felt to be anatomic and very strong.    The wound was copiously irrigated with normal saline and  the deltotrapezial fascia was  then carefully closed over the construct with #0 vicryl sutures in  interrupted fashion. The skin was then closed with 2-0 Vicryl in a deep  dermal layer, 4-0 Monocryl for skin closure. Steri-Strips were applied.   Sterile dressings were applied including a medium  Mepilex dressing. The patient was then allowed to awaken from general  anesthesia, placed in a sling, transferred to stretcher and taken to the  recovery room in stable condition.   POSTOPERATIVE PLAN: She will be observed in the recovery room and if pain is well controlled and the patient is doing well clinically she will be discharged home today with her family.  She will follow-up in 10 to 14 days for wound check.
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
## 2018-05-11 NOTE — Anesthesia Procedure Notes (Signed)
Anesthesia Regional Block: Interscalene brachial plexus block   Pre-Anesthetic Checklist: ,, timeout performed, Correct Patient, Correct Site, Correct Laterality, Correct Procedure, Correct Position, site marked, Risks and benefits discussed,  Surgical consent,  Pre-op evaluation,  At surgeon's request and post-op pain management  Laterality: Left  Prep: chloraprep       Needles:  Injection technique: Single-shot  Needle Type: Echogenic Stimulator Needle     Needle Length: 5cm  Needle Gauge: 21     Additional Needles:   Procedures:, nerve stimulator,,,,,,,   Nerve Stimulator or Paresthesia:  Response: 0.4 mA,   Additional Responses:   Narrative:  Start time: 05/11/2018 11:40 AM End time: 05/11/2018 11:49 AM Injection made incrementally with aspirations every 5 mL.  Performed by: Personally  Anesthesiologist: Arta Brucessey, Harrietta Incorvaia, MD  Additional Notes: Monitors applied. Patient sedated. Sterile prep and drape,hand hygiene and sterile gloves were used. Relevant anatomy identified.Needle position confirmed.Local anesthetic injected incrementally after negative aspiration. Local anesthetic spread visualized around nerve(s). Vascular puncture avoided. No complications. Image printed for medical record.The patient tolerated the procedure well.

## 2018-05-12 ENCOUNTER — Encounter (HOSPITAL_COMMUNITY): Payer: Self-pay | Admitting: Orthopedic Surgery

## 2019-10-19 ENCOUNTER — Ambulatory Visit: Payer: PRIVATE HEALTH INSURANCE | Attending: Internal Medicine

## 2019-10-19 DIAGNOSIS — Z20822 Contact with and (suspected) exposure to covid-19: Secondary | ICD-10-CM

## 2019-10-21 LAB — NOVEL CORONAVIRUS, NAA: SARS-CoV-2, NAA: NOT DETECTED

## 2019-11-08 ENCOUNTER — Ambulatory Visit: Payer: PRIVATE HEALTH INSURANCE | Attending: Internal Medicine

## 2019-11-08 DIAGNOSIS — Z20822 Contact with and (suspected) exposure to covid-19: Secondary | ICD-10-CM

## 2019-11-09 LAB — NOVEL CORONAVIRUS, NAA: SARS-CoV-2, NAA: NOT DETECTED

## 2021-08-05 ENCOUNTER — Other Ambulatory Visit: Payer: Self-pay | Admitting: Nurse Practitioner

## 2021-08-05 DIAGNOSIS — R1011 Right upper quadrant pain: Secondary | ICD-10-CM

## 2022-01-07 ENCOUNTER — Other Ambulatory Visit: Payer: Self-pay | Admitting: Nurse Practitioner

## 2022-01-07 DIAGNOSIS — Z1231 Encounter for screening mammogram for malignant neoplasm of breast: Secondary | ICD-10-CM

## 2022-01-08 ENCOUNTER — Ambulatory Visit
Admission: RE | Admit: 2022-01-08 | Discharge: 2022-01-08 | Disposition: A | Discharge status: Home / Self Care | Payer: 59 | Source: Ambulatory Visit | Attending: Nurse Practitioner | Admitting: Nurse Practitioner

## 2022-01-08 DIAGNOSIS — Z1231 Encounter for screening mammogram for malignant neoplasm of breast: Secondary | ICD-10-CM

## 2022-08-27 IMAGING — MG MM DIGITAL SCREENING BILAT W/ TOMO AND CAD
8 series · 8 of 24 positions shown · non-contrast
Comparison: Previous exam(s).

CLINICAL DATA: Screening.

EXAM:
DIGITAL SCREENING BILATERAL MAMMOGRAM WITH TOMOSYNTHESIS AND CAD
TECHNIQUE: Bilateral screening digital craniocaudal and mediolateral oblique
mammograms were obtained. Bilateral screening digital breast
tomosynthesis was performed. The images were evaluated with
computer-aided detection.

[L MLO synth-2D]
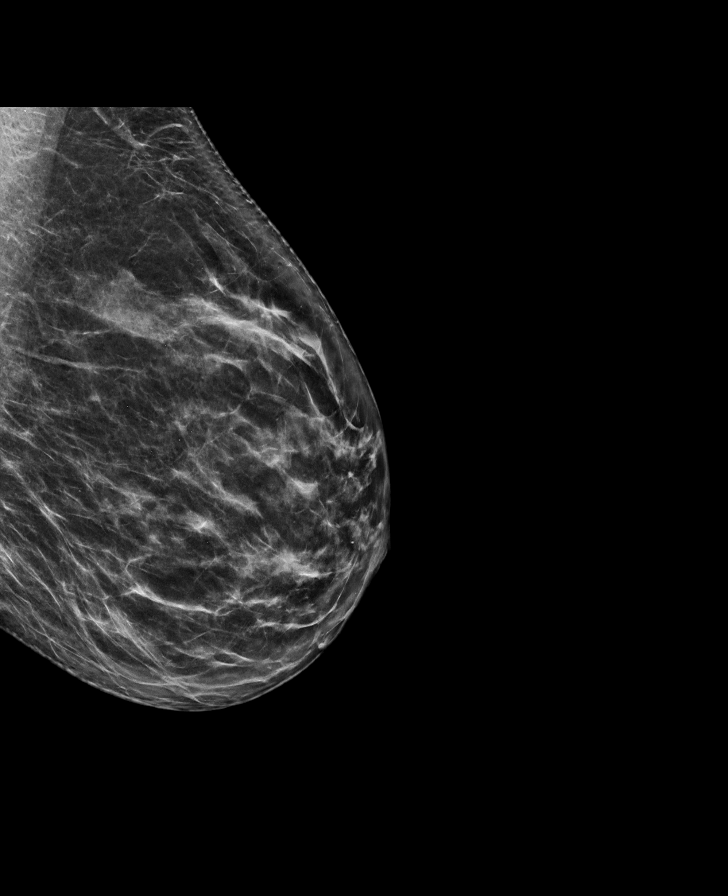

[R CC synth-2D]
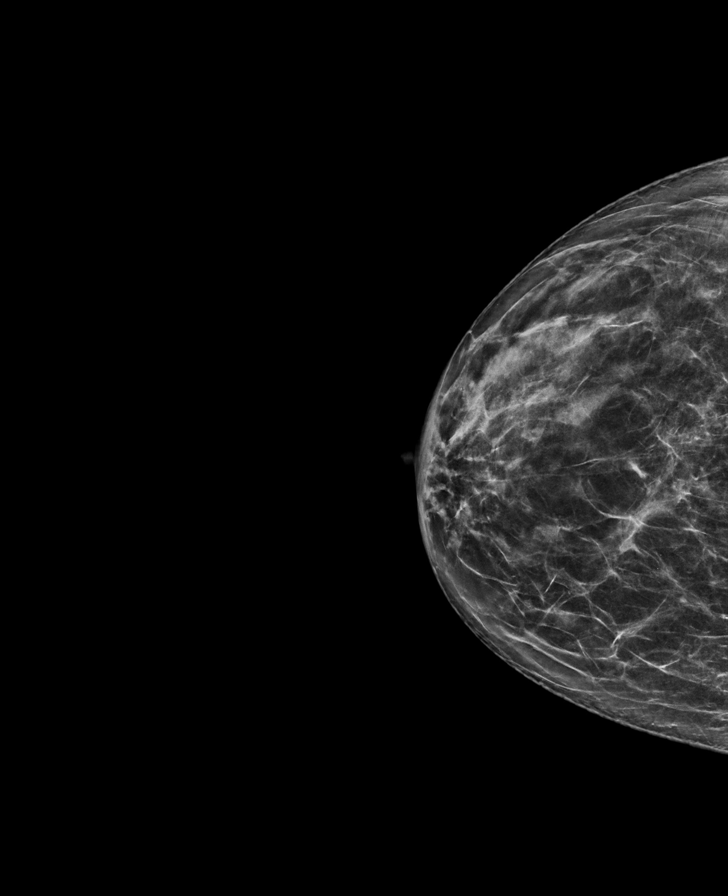

[L CC synth-2D]
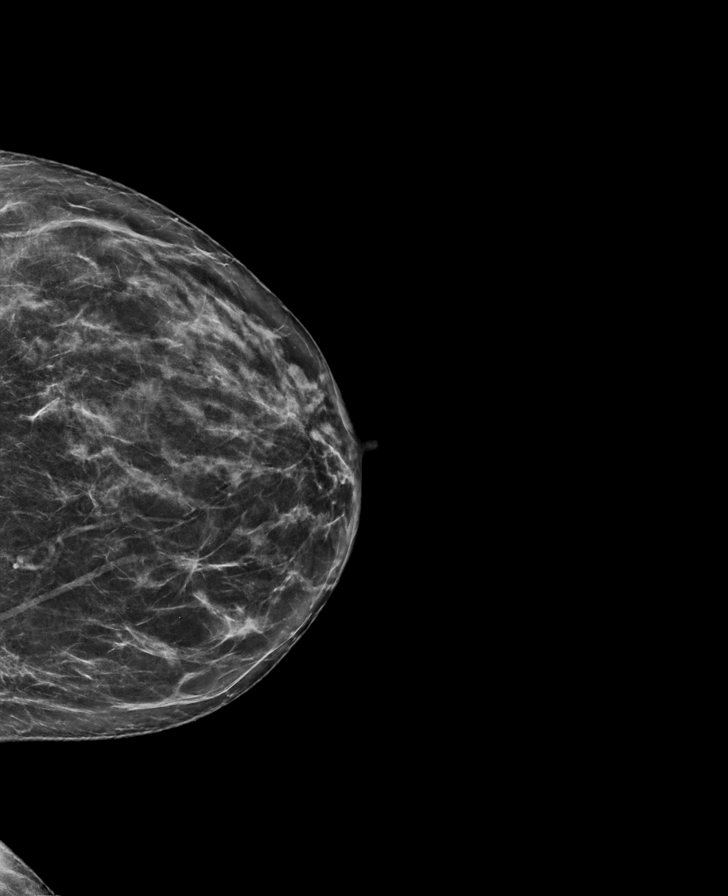

[R MLO synth-2D]
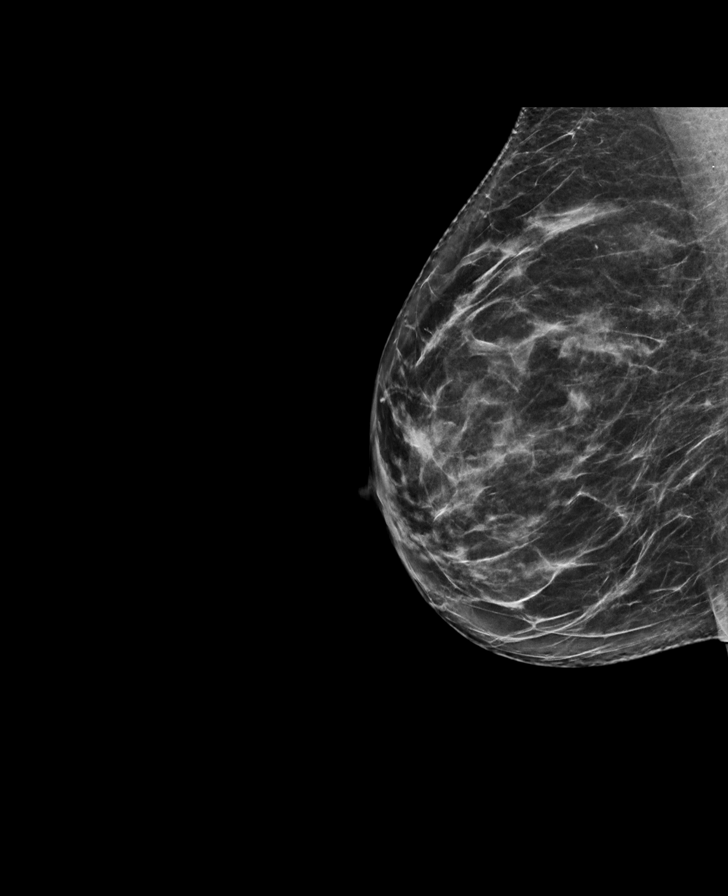

[L CC tomo · tomo slice 37/74.0]
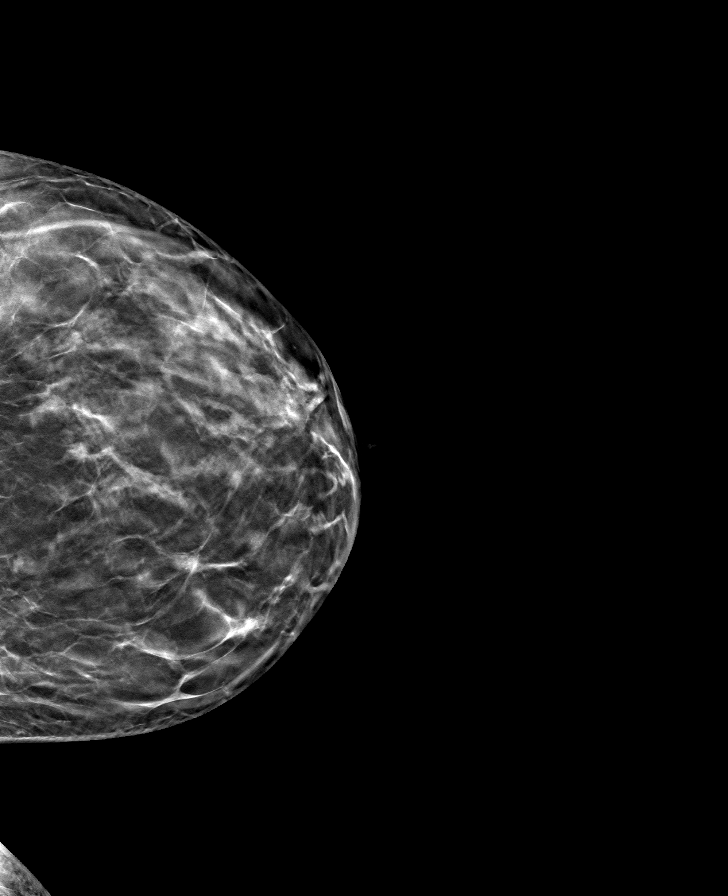

[R CC tomo · tomo slice 37/73.0]
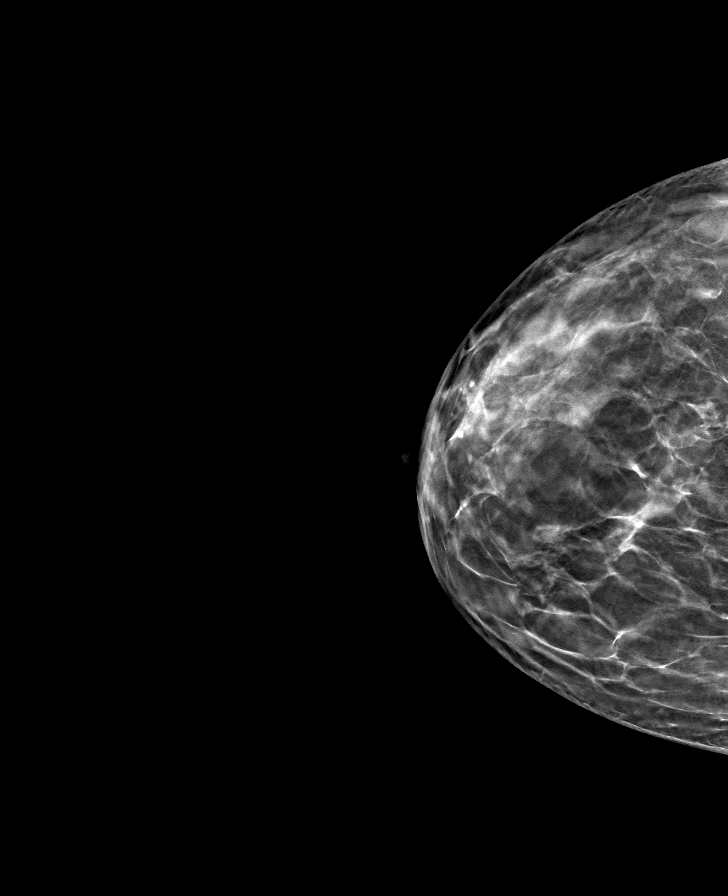

[R MLO tomo · tomo slice 38/75.0]
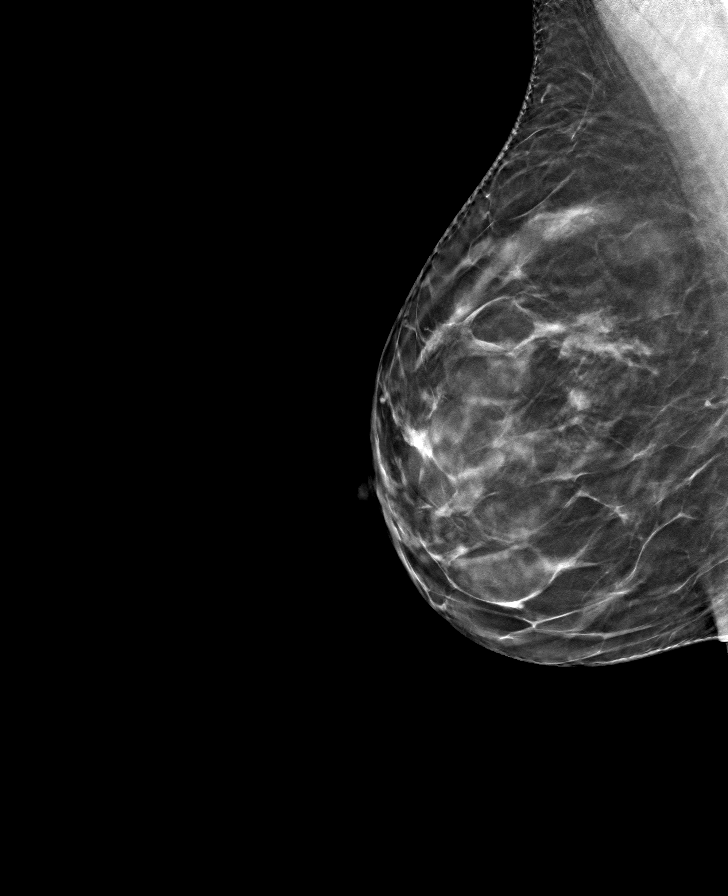

[L MLO tomo · tomo slice 39/77.0]
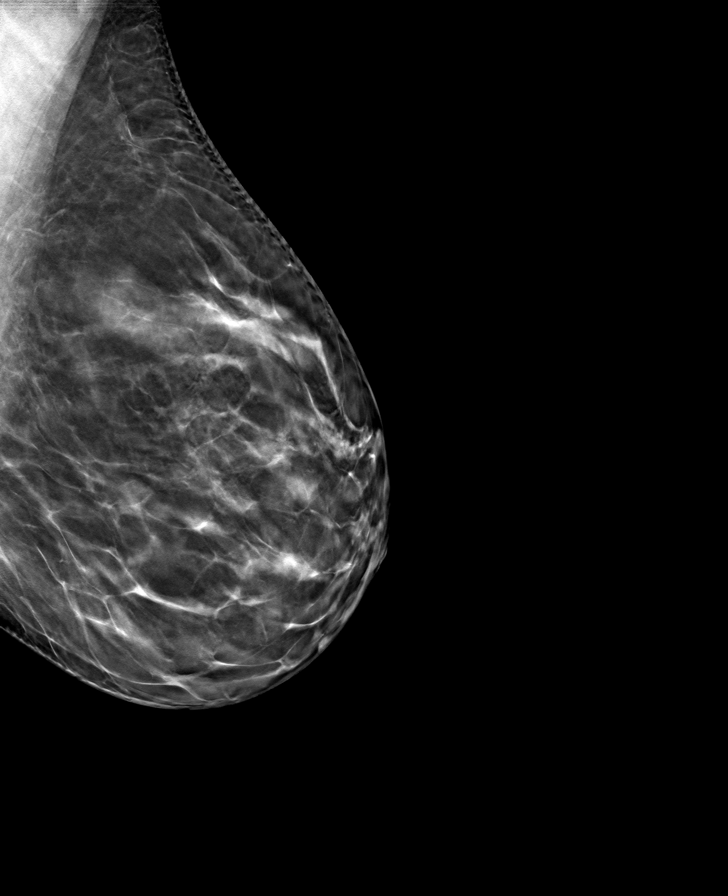

[8 of 24 positions shown; findings below may reference images not displayed]

ACR Breast Density Category c: The breast tissue is heterogeneously
dense, which may obscure small masses.
FINDINGS: There are no findings suspicious for malignancy.
IMPRESSION: No mammographic evidence of malignancy. A result letter of this
screening mammogram will be mailed directly to the patient.

RECOMMENDATION:
Screening mammogram in one year. (Code:Q3-W-BC3)

BI-RADS CATEGORY  1: Negative.

## 2023-07-01 ENCOUNTER — Other Ambulatory Visit: Payer: Self-pay | Admitting: Family Medicine

## 2023-07-01 DIAGNOSIS — Z1231 Encounter for screening mammogram for malignant neoplasm of breast: Secondary | ICD-10-CM

## 2023-07-02 ENCOUNTER — Ambulatory Visit
Admission: RE | Admit: 2023-07-02 | Discharge: 2023-07-02 | Disposition: A | Discharge status: Home / Self Care | Payer: 59 | Source: Ambulatory Visit | Attending: Family Medicine | Admitting: Family Medicine

## 2023-07-02 DIAGNOSIS — Z1231 Encounter for screening mammogram for malignant neoplasm of breast: Secondary | ICD-10-CM

## 2023-11-23 ENCOUNTER — Other Ambulatory Visit: Payer: Self-pay

## 2023-11-23 ENCOUNTER — Emergency Department (HOSPITAL_BASED_OUTPATIENT_CLINIC_OR_DEPARTMENT_OTHER): Payer: 59 | Admitting: Radiology

## 2023-11-23 ENCOUNTER — Emergency Department (HOSPITAL_BASED_OUTPATIENT_CLINIC_OR_DEPARTMENT_OTHER)
Admission: EM | Admit: 2023-11-23 | Discharge: 2023-11-23 | Disposition: A | Discharge status: Home / Self Care | Payer: 59

## 2023-11-23 DIAGNOSIS — R079 Chest pain, unspecified: Secondary | ICD-10-CM | POA: Diagnosis present

## 2023-11-23 LAB — TROPONIN I (HIGH SENSITIVITY)
Troponin I (High Sensitivity): 3 ng/L (ref <18)
Troponin I (High Sensitivity): 2 ng/L (ref <18)

## 2023-11-23 LAB — CBC
HCT: 42.1 % (ref 36.0–46.0)
Hemoglobin: 14.3 g/dL (ref 12.0–15.0)
MCH: 31.4 pg (ref 26.0–34.0)
MCHC: 34 g/dL (ref 30.0–36.0)
MCV: 92.5 fL (ref 80.0–100.0)
Platelets: 202 10*3/uL (ref 150–400)
RBC: 4.55 MIL/uL (ref 3.87–5.11)
RDW: 12 % (ref 11.5–15.5)
WBC: 4.8 10*3/uL (ref 4.0–10.5)
nRBC: 0 % (ref 0.0–0.2)

## 2023-11-23 LAB — BASIC METABOLIC PANEL
Anion gap: 9 (ref 5–15)
BUN: 15 mg/dL (ref 6–20)
CO2: 25 mmol/L (ref 22–32)
Calcium: 9.4 mg/dL (ref 8.9–10.3)
Chloride: 103 mmol/L (ref 98–111)
Creatinine, Ser: 0.84 mg/dL (ref 0.44–1.00)
GFR, Estimated: >60 mL/min (ref >60)
Glucose, Bld: 100 mg/dL — ABNORMAL HIGH (ref 70–99)
Potassium: 3.9 mmol/L (ref 3.5–5.1)
Sodium: 137 mmol/L (ref 135–145)

## 2023-11-23 NOTE — Discharge Instructions (Signed)
Please follow-up with your primary doctor.  Return immediately felt fevers, chills, chest pain returns, shortness of breath, palpitations, passout or you develop any new or worsening symptoms that are concerning to you.

## 2023-11-23 NOTE — ED Triage Notes (Signed)
Pt POV from home reporting intermittent L side chest pain that began last night. States she has had similar episodes over the past year, is under a lot of stress.

## 2023-11-23 NOTE — ED Provider Notes (Signed)
[place] EMERGENCY DEPARTMENT AT [facility] Provider Note   CSN: [id number] Arrival date & time: 11/23/23  4098     History  No chief complaint on file.   Nacole AZIZI BALLY is a 53 y.o. female.  This is a 53 year old female presenting emergency department for chest pain.  Intermittent overnight.  Not currently having any chest pain.  Described as dull.  Has had some occasional symptoms this past summer.  No other medical history.        Home Medications Prior to Admission medications   Medication Sig Start Date End Date Taking? Authorizing Provider  docusate sodium (COLACE) 100 MG capsule Take 1 capsule (100 mg total) by mouth 3 (three) times daily as needed. 05/11/18   Jiles Harold, PA-C  oxyCODONE-acetaminophen (PERCOCET) 5-325 MG tablet Take 1-2 tablets by mouth every 4 (four) hours as needed for severe pain. 05/11/18   Jiles Harold, PA-C      Allergies    Codeine    Review of Systems   Review of Systems  Physical Exam Updated Vital Signs BP 102/78   Pulse 77   Temp 97.6 F (36.4 C) (Temporal)   Resp 17   Ht 5\' 7"  (1.702 m)   Wt 67.1 kg   SpO2 97%   BMI 23.18 kg/m  Physical Exam Vitals and nursing note reviewed.  HENT:     Head: Normocephalic.     Nose: Nose normal.     Mouth/Throat:     Mouth: Mucous membranes are moist.  Eyes:     Conjunctiva/sclera: Conjunctivae normal.  Cardiovascular:     Rate and Rhythm: Normal rate and regular rhythm.  Pulmonary:     Effort: Pulmonary effort is normal.  Abdominal:     General: Abdomen is flat. There is no distension.     Tenderness: There is no abdominal tenderness. There is no guarding or rebound.  Musculoskeletal:        General: Normal range of motion.  Skin:    General: Skin is warm.     Capillary Refill: Capillary refill takes less than 2 seconds.  Neurological:     Mental Status: She is alert and oriented to person, place, and time.  Psychiatric:        Mood and Affect: Mood  normal.        Behavior: Behavior normal.     ED Results / Procedures / Treatments   Labs (all labs ordered are listed, but only abnormal results are displayed) Labs Reviewed  BASIC METABOLIC PANEL - Abnormal; Notable for the following components:      Result Value   Glucose, Bld 100 (*)    All other components within normal limits  CBC  PREGNANCY, URINE  TROPONIN I (HIGH SENSITIVITY)  TROPONIN I (HIGH SENSITIVITY)    EKG EKG Interpretation Date/Time:  Monday November 23 2023 06:49:50 EST Ventricular Rate:  75 PR Interval:  106 QRS Duration:  82 QT Interval:  392 QTC Calculation: 437 R Axis:   92  Text Interpretation: Sinus rhythm with short PR Rightward axis Borderline ECG No previous ECGs available Confirmed by Estanislado Pandy [phone]) on 11/23/2023 9:44:05 AM  Radiology DG Chest 2 View Result Date: 11/23/2023 CLINICAL DATA:  Intermittent left-sided chest pain. EXAM: CHEST - 2 VIEW COMPARISON:  None Available. FINDINGS: The lungs are clear without focal pneumonia, edema, pneumothorax or pleural effusion. The cardiopericardial silhouette is within normal limits for size. No acute bony abnormality. Old posterior left third rib fracture evident. IMPRESSION:  No active cardiopulmonary disease. Electronically Signed   By: Kennith Center M.D.   On: 11/23/2023 07:41    Procedures Procedures    Medications Ordered in ED Medications - No data to display  ED Course/ Medical Decision Making/ A&P                                 Medical Decision Making This is a 53 year old female with no significant past medical history presenting emergency department for chest pain.  Afebrile vital signs reassuring.  Does not appear to be in distress.  Normal sinus rhythm on the monitor on my independent interpretation.  EKG without ST segment changes to indicate ischemia on my independent interpretation.  Troponin is negative x 2.  ACS unlikely.  Low risk heart score.  Chest x-ray without pneumonia or  pneumothorax on my independent interpretation.  Radiology agrees.  She has no leukocytosis to suggest systemic infection.  Basic metabolic panel without significant derangements.  Normal kidney function.  History not consistent with PE.  Patient is asymptomatic currently.  Feel that she is stable for discharge at this time to follow-up with primary doctor/cardiology.  Amount and/or Complexity of Data Reviewed Labs: ordered. Radiology: ordered.         Final Clinical Impression(s) / ED Diagnoses Final diagnoses:  None    Rx / DC Orders ED Discharge Orders     None         Coral Spikes, DO 11/23/23 1029

## 2024-06-30 ENCOUNTER — Other Ambulatory Visit (HOSPITAL_BASED_OUTPATIENT_CLINIC_OR_DEPARTMENT_OTHER): Payer: Self-pay | Admitting: Physician Assistant

## 2024-06-30 DIAGNOSIS — N951 Menopausal and female climacteric states: Secondary | ICD-10-CM

## 2024-08-12 ENCOUNTER — Other Ambulatory Visit: Payer: Self-pay | Admitting: Physician Assistant

## 2024-08-12 DIAGNOSIS — Z1231 Encounter for screening mammogram for malignant neoplasm of breast: Secondary | ICD-10-CM

## 2024-08-31 ENCOUNTER — Ambulatory Visit
Admission: RE | Admit: 2024-08-31 | Discharge: 2024-08-31 | Disposition: A | Discharge status: Home / Self Care | Source: Ambulatory Visit | Attending: Physician Assistant | Admitting: Physician Assistant

## 2024-08-31 DIAGNOSIS — Z1231 Encounter for screening mammogram for malignant neoplasm of breast: Secondary | ICD-10-CM

## 2024-10-19 ENCOUNTER — Ambulatory Visit

## 2024-10-19 DIAGNOSIS — N951 Menopausal and female climacteric states: Secondary | ICD-10-CM
